# Patient Record
Sex: Female | Born: 1950 | Hispanic: No | Marital: Married | Smoking: Never smoker
Health system: Southern US, Community
[De-identification: ages and names within clinical notes are randomized; demographics above are authoritative.]

## PROBLEM LIST (undated history)

## (undated) DIAGNOSIS — Z86010 Personal history of colonic polyps: Secondary | ICD-10-CM

## (undated) DIAGNOSIS — F4323 Adjustment disorder with mixed anxiety and depressed mood: Secondary | ICD-10-CM

## (undated) DIAGNOSIS — E7800 Pure hypercholesterolemia, unspecified: Secondary | ICD-10-CM

## (undated) DIAGNOSIS — M159 Polyosteoarthritis, unspecified: Secondary | ICD-10-CM

## (undated) DIAGNOSIS — M810 Age-related osteoporosis without current pathological fracture: Secondary | ICD-10-CM

## (undated) HISTORY — DX: Personal history of colonic polyps: Z86.010

## (undated) HISTORY — DX: Polyosteoarthritis, unspecified: M15.9

## (undated) HISTORY — DX: Pure hypercholesterolemia, unspecified: E78.00

## (undated) HISTORY — DX: Adjustment disorder with mixed anxiety and depressed mood: F43.23

## (undated) HISTORY — PX: CHOLECYSTECTOMY: SHX55

## (undated) HISTORY — DX: Age-related osteoporosis without current pathological fracture: M81.0

---

## 2003-08-07 ENCOUNTER — Observation Stay (HOSPITAL_COMMUNITY): Admission: RE | Admit: 2003-08-07 | Discharge: 2003-08-08 | Payer: Self-pay | Admitting: Urology

## 2005-02-21 IMAGING — CR DG CHEST 2V
2 series · 2 of 2 positions shown · non-contrast
Comparison: None.

CLINICAL DATA: Urinary incontinence.  Preoperative respiratory exam.
 PA AND LATERAL CHEST

[view not recorded (1 of 2)]
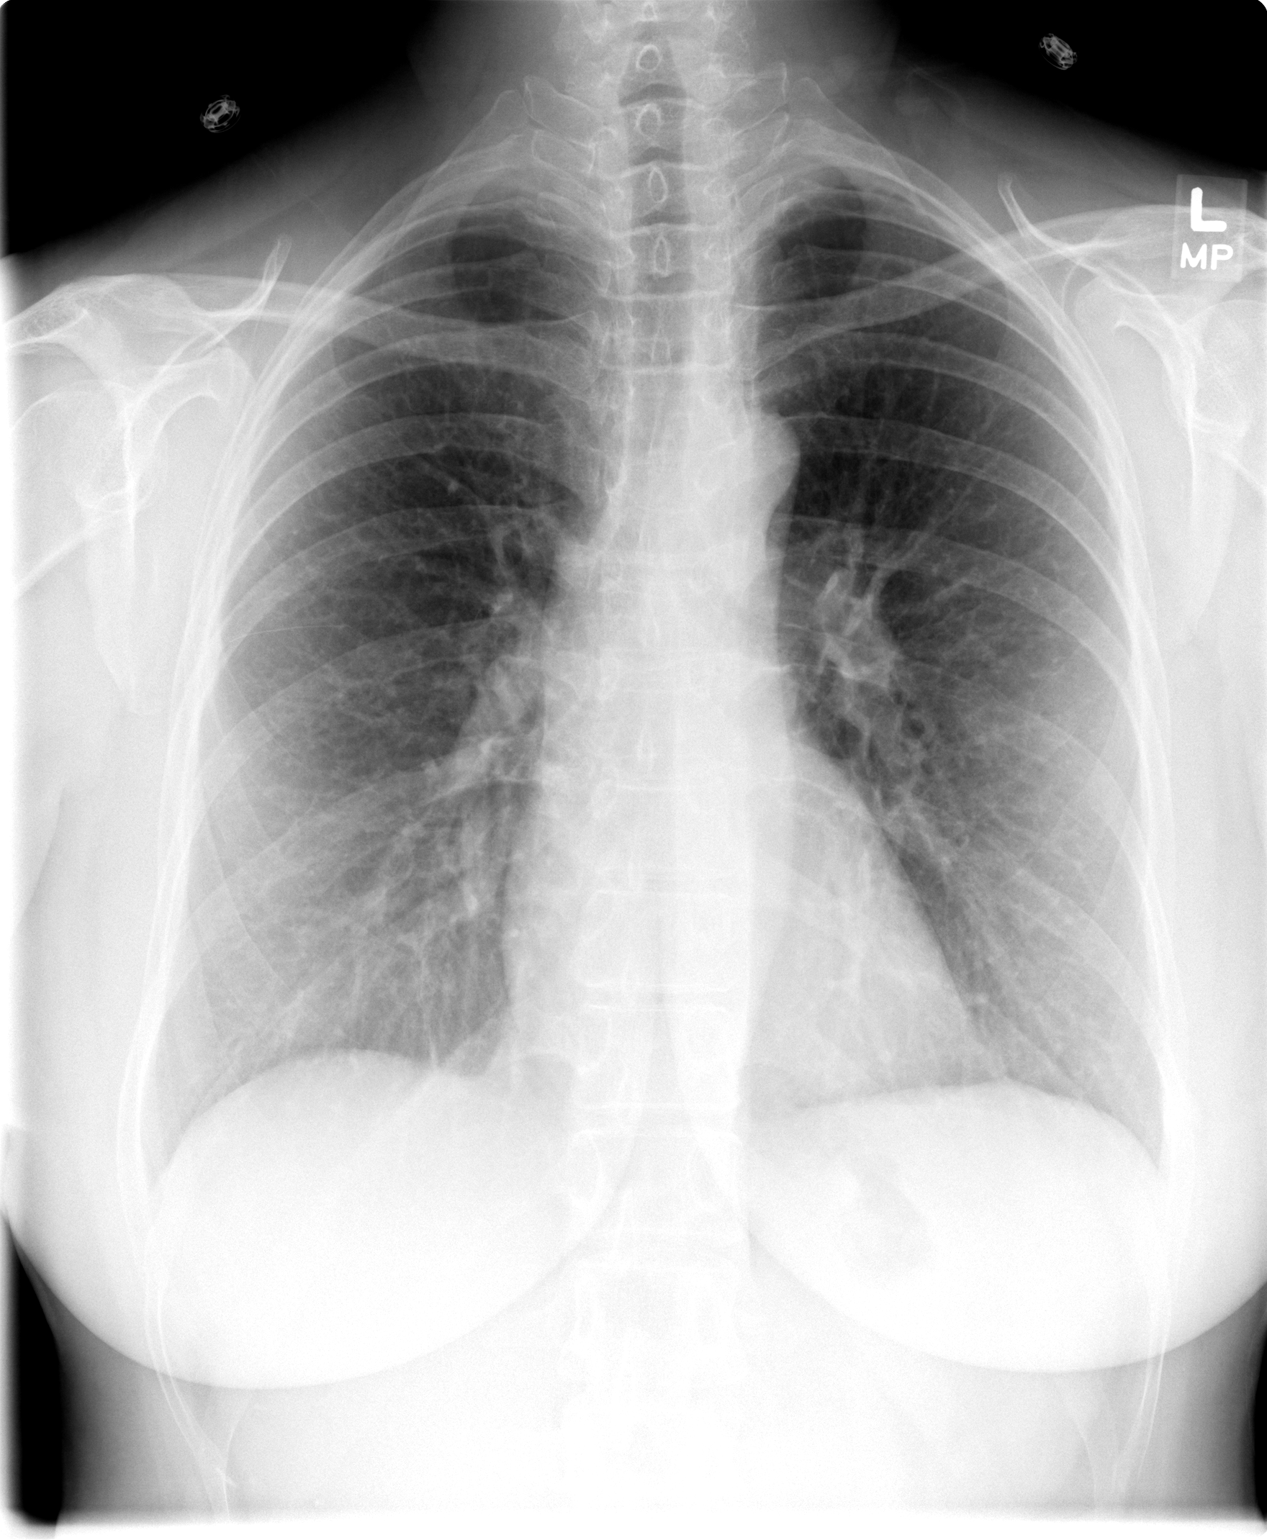

[view not recorded (2 of 2)]
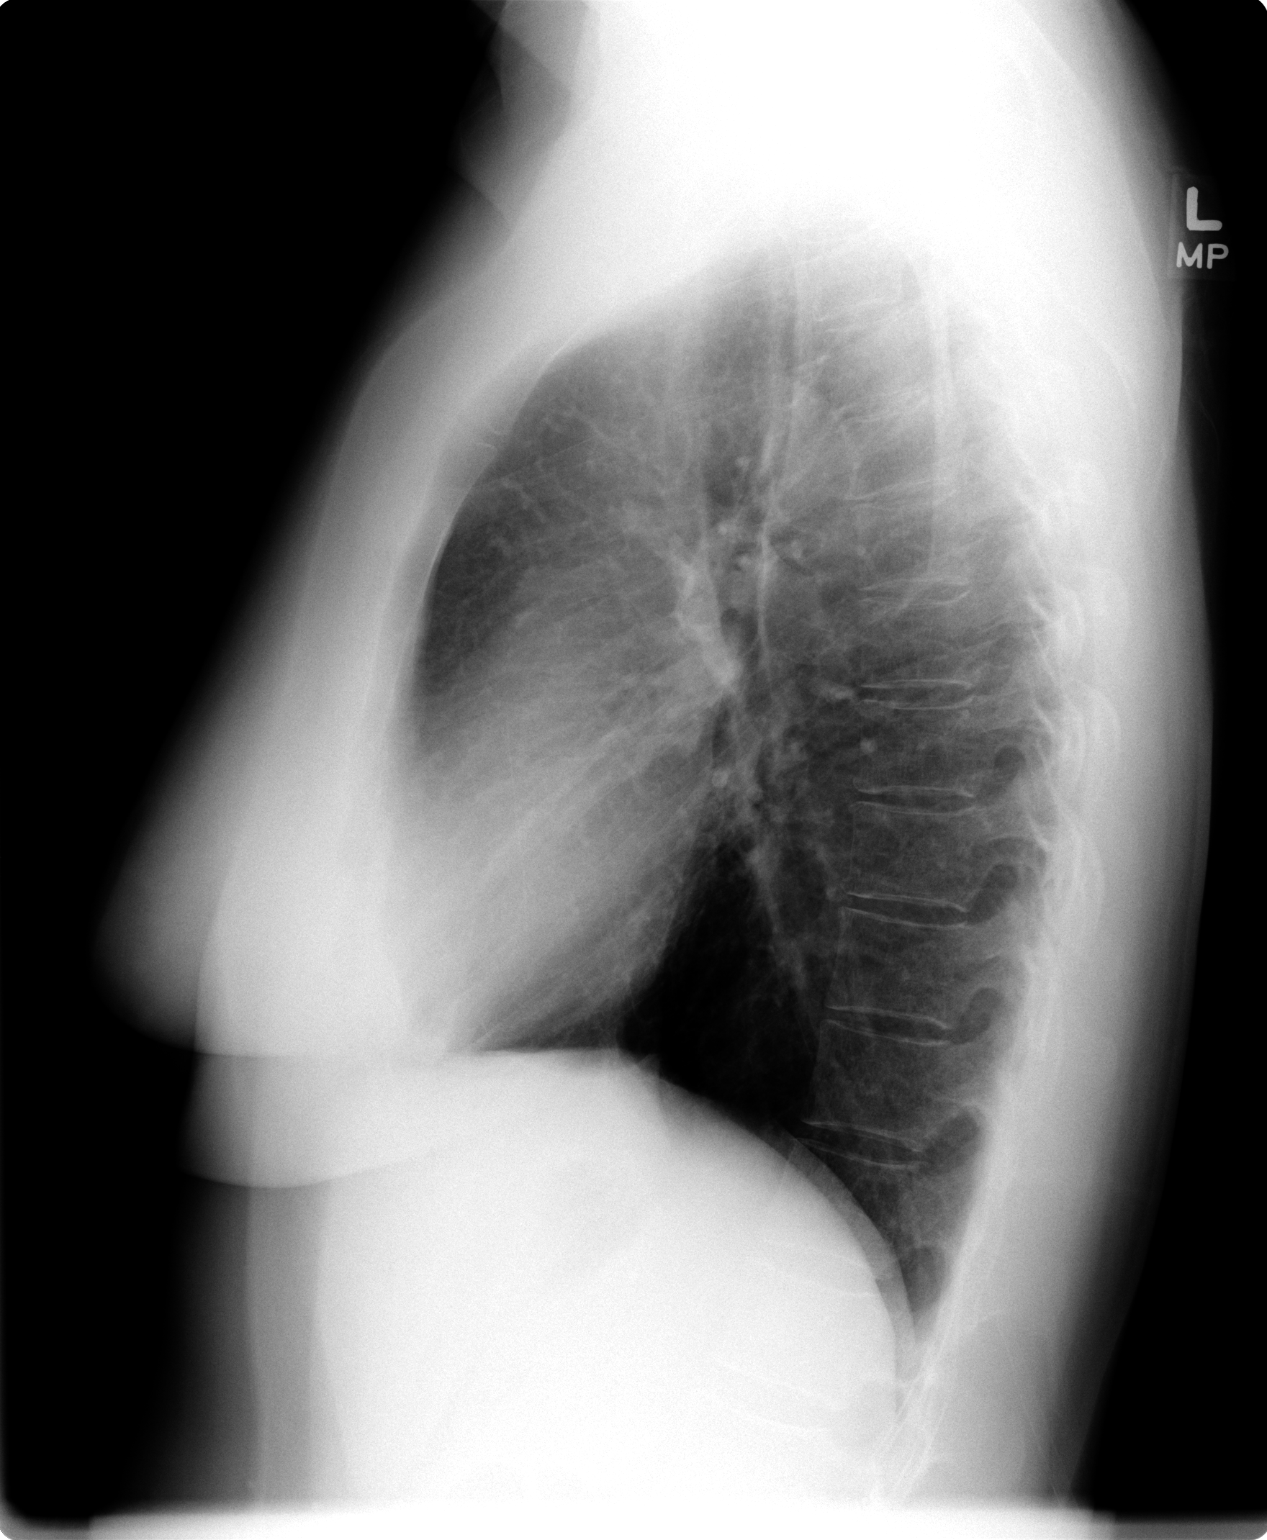

[2 of 2 positions shown; findings below may reference images not displayed]

The cardiomediastinal contours are normal.  The lungs are clear.  There is no pleural effusion or pneumothorax.  
 IMPRESSION
 No active cardiopulmonary process is demonstrated.

## 2014-06-17 HISTORY — PX: BUNIONECTOMY: SHX129

## 2016-08-30 ENCOUNTER — Encounter: Payer: Self-pay | Admitting: Cardiovascular Disease

## 2016-09-11 NOTE — Progress Notes (Signed)
Cardiology Office Note   Date:  09/15/2016   ID:  Renee Wilson, Renee Wilson 30-Dec-1950, MRN [id number]  PCP:  Lise Auer, MD  Cardiologist:   Charlton Haws, MD   Chief Complaint  Patient presents with  . Establish Care       History of Present Illness: Renee Wilson is a 66 y.o. female who presents for evaluation/consultation CAD Referred by Dr Flonnie Overman [facility] Reviewed his office note from 08/15/16 and only mentions referral for family history of premature CAD Patient wishes to assess her CV risk   Chol 230 LDL 165 had leg pains with simvastatin   Father with ischemic heart disease paternal grandmother and her brother with died MI 42's  She hears her pulse in left neck especially at rest on side  No TIA symptoms  On her feet a lot some SSCP sharp and fleeting with exertion.   4 years ago was on simvastatin and felt her legs ached more  Reviewed labs and LDL 119   Also complains of some exertional dyspnea and palpitations   Past Medical History:  Diagnosis Date  . Adjustment disorder with mixed anxiety and depressed mood   . Generalized osteoarthritis of hand   . H/O adenomatous polyp of colon   . Hypercholesterolemia   . Osteoporosis     Past Surgical History:  Procedure Laterality Date  . BUNIONECTOMY  06/17/2014  . CHOLECYSTECTOMY       Current Outpatient Prescriptions  Medication Sig Dispense Refill  . ALPRAZolam (XANAX) 0.5 MG tablet Take 0.5 mg by mouth at bedtime as needed for anxiety.    . cetirizine (ZYRTEC) 10 MG tablet Take 10 mg by mouth daily.    . cyclobenzaprine (FLEXERIL) 10 MG tablet Take 10 mg by mouth 3 (three) times daily as needed for muscle spasms.    . meclizine (ANTIVERT) 25 MG tablet Take 25 mg by mouth 3 (three) times daily as needed for dizziness.    . sertraline (ZOLOFT) 100 MG tablet Take 100 mg by mouth daily.    . Vitamin D, Ergocalciferol, (DRISDOL) 50000 units CAPS capsule Take 50,000 Units by mouth every 7 (seven) days.     Marland Kitchen zolpidem (AMBIEN CR) 12.5 MG CR tablet Take 12.5 mg by mouth at bedtime as needed for sleep.    . rosuvastatin (CRESTOR) 5 MG tablet Take 1 tablet (5 mg total) by mouth daily. 90 tablet 3   No current facility-administered medications for this visit.     Allergies:   Morphine    Social History:  The patient  reports that she has never smoked. She has never used smokeless tobacco. She reports that she does not drink alcohol or use drugs.   Family History:  The patient's family history includes Heart attack in her paternal grandmother; Heart disease in her father; Ovarian cancer (age of onset: 37) in her mother.    ROS:  Please see the history of present illness.   Otherwise, review of systems are positive for none.   All other systems are reviewed and negative.    PHYSICAL EXAM: VS:  BP 124/74   Pulse 83   Ht  (1.702 m)   Wt 170 lb 6.4 oz (77.3 kg)   SpO2 96%   BMI 26.69 kg/m  , BMI Body mass index is 26.69 kg/m. Affect appropriate Healthy:  appears stated age HEENT: normal Neck supple with no adenopathy JVP normal no bruits no thyromegaly Lungs clear with no  wheezing and good diaphragmatic motion Heart:  S1/S2 no murmur, no rub, gallop or click PMI normal Abdomen: benighn, BS positve, no tenderness, no AAA no bruit.  No HSM or HJR Distal pulses intact with no bruits No edema Neuro non-focal Skin warm and dry No muscular weakness    EKG:  NSR normal ECG 09/15/16    Recent Labs: No results found for requested labs within last 8760 hours.    Lipid Panel No results found for: CHOL, TRIG, HDL, CHOLHDL, VLDL, LDLCALC, LDLDIRECT    Wt Readings from Last 3 Encounters:  09/15/16 170 lb 6.4 oz (77.3 kg)      Other studies Reviewed: Additional studies/ records that were reviewed today include: Office notes Dr Flonnie Overman [facility] family and  Labs .    ASSESSMENT AND PLAN:  1.  Family History CAD vascular screening bundle ordered 2. Cholesterol start  crestor 5 mg M/W/F taget LDL 70 or less 3. Depression continue zoloft seems compensated 4. Chest Pain atypical f/u ETT with normal ECG 5. Carotid faint murmur radiating to left clavicle f/u carotid duplex  6. Palpitations: benign related to anxiety ECG normal no need for monitor TSH/T4    Current medicines are reviewed at length with the patient today.  The patient does not have concerns regarding medicines.  The following changes have been made:  Crestor   Labs/ tests ordered today include: TSH/T4 carotid vascular bundle ETT   Orders Placed This Encounter  Procedures  . EXERCISE TOLERANCE TEST  . EKG 12-Lead     Disposition:   FU with me PRN      Signed, Charlton Haws, MD  09/15/2016 9:20 AM    [facility] [address] [place], [place], [place]  [address] Phone: [phone]; Fax: [phone]

## 2016-09-15 ENCOUNTER — Encounter (INDEPENDENT_AMBULATORY_CARE_PROVIDER_SITE_OTHER): Payer: Self-pay

## 2016-09-15 ENCOUNTER — Ambulatory Visit (INDEPENDENT_AMBULATORY_CARE_PROVIDER_SITE_OTHER): Payer: Commercial Managed Care - PPO | Admitting: Cardiovascular Disease

## 2016-09-15 ENCOUNTER — Encounter: Payer: Self-pay | Admitting: Cardiovascular Disease

## 2016-09-15 VITALS — BP 124/74 | HR 83 | Ht 67.0 in | Wt 170.4 lb

## 2016-09-15 DIAGNOSIS — R0989 Other specified symptoms and signs involving the circulatory and respiratory systems: Secondary | ICD-10-CM

## 2016-09-15 DIAGNOSIS — R079 Chest pain, unspecified: Secondary | ICD-10-CM | POA: Diagnosis not present

## 2016-09-15 DIAGNOSIS — Z7689 Persons encountering health services in other specified circumstances: Secondary | ICD-10-CM

## 2016-09-15 MED ORDER — ROSUVASTATIN CALCIUM 5 MG PO TABS
5.0000 mg | ORAL_TABLET | Freq: Every day | ORAL | 3 refills | Status: AC
Start: 2016-09-15 — End: 2016-12-14

## 2016-09-15 NOTE — Patient Instructions (Addendum)
Medication Instructions:  Your physician has recommended you make the following change in your medication:  START Crestor 5 mg by mouth daily  Lab work: NONE  Testing/Procedures: Your physician has requested that you have a vascular screening and carotid duplex at our [facility]. This test is an ultrasound of the carotid arteries in your neck. It looks at blood flow through these arteries that supply the brain with blood. Allow one hour for this exam. There are no restrictions or special instructions.  Your physician has requested that you have an exercise tolerance test at our [facility]. For further information please visit [email or web address] Please also follow instruction sheet, as given.  Follow-Up: Your physician wants you to follow-up as needed with Dr. Eden Emms.   If you need a refill on your cardiac medications before your next appointment, please call your pharmacy.

## 2016-10-04 ENCOUNTER — Telehealth (HOSPITAL_COMMUNITY): Payer: Self-pay

## 2016-10-04 NOTE — Telephone Encounter (Signed)
Encounter complete. 

## 2016-10-05 ENCOUNTER — Ambulatory Visit (HOSPITAL_COMMUNITY)
Admission: RE | Admit: 2016-10-05 | Discharge: 2016-10-05 | Disposition: A | Discharge status: Home / Self Care | Payer: Commercial Managed Care - PPO | Source: Ambulatory Visit | Attending: Cardiology | Admitting: Cardiology

## 2016-10-05 DIAGNOSIS — I6523 Occlusion and stenosis of bilateral carotid arteries: Secondary | ICD-10-CM | POA: Insufficient documentation

## 2016-10-05 DIAGNOSIS — R079 Chest pain, unspecified: Secondary | ICD-10-CM | POA: Diagnosis not present

## 2016-10-05 DIAGNOSIS — R0989 Other specified symptoms and signs involving the circulatory and respiratory systems: Secondary | ICD-10-CM

## 2016-10-05 LAB — EXERCISE TOLERANCE TEST
CHL RATE OF PERCEIVED EXERTION: 18
CSEPED: 6 min
CSEPEDS: 1 s
Estimated workload: 7 METS
MPHR: 152 {beats}/min
Peak HR: 146 {beats}/min
Percent HR: 96 %
Rest HR: 61 {beats}/min

## 2016-10-11 ENCOUNTER — Telehealth: Payer: Self-pay | Admitting: *Deleted

## 2016-10-11 ENCOUNTER — Telehealth: Payer: Self-pay

## 2016-10-11 NOTE — Telephone Encounter (Addendum)
LVM for pt, informed her of 81 mg ASA added to list & ok to take.  VAS US CAROTID  Order: [id number]  Status:  Final result Visible to patient:  No (Not Released) Dx:  Bruit  Notes recorded by Wendall StadeNishan, Peter C, MD on 10/11/2016 at 10:27 AM EDT 81 mg is fine ------  Notes recorded by Ethelda ChickPate Ingalls, Pamela, RN on 10/11/2016 at 10:00 AM EDT Patient call back for her carotid results. Per Dr. Eden EmmsNishan, Plaque no stenosis f/u carotid duplex in 2 years. Patient wanted to know if she should start taking ASA, will forward to Dr. Eden EmmsNishan for advisement. ------  Notes recorded by Ethelda ChickPate Ingalls, Pamela, RN on 10/11/2016 at 9:01 AM EDT Patient will need carotid duplex repeated in May 2020 ------  Notes recorded by Ethelda ChickPate Ingalls, Pamela, RN on 10/11/2016 at 8:59 AM EDT Left message for patient to call back. ------  Notes recorded by Wendall StadeNishan, Peter C, MD on 10/07/2016 at 12:15 PM EDT Plaque no stenosis f/u carotid duplex in 2 years

## 2016-10-11 NOTE — Telephone Encounter (Signed)
-----   Message from Wendall StadePeter C Nishan, MD sent at 10/07/2016 12:15 PM EDT ----- Plaque no stenosis f/u carotid duplex in 2 years

## 2016-10-11 NOTE — Progress Notes (Signed)
Patient will need carotid duplex repeated in May 2020

## 2016-10-11 NOTE — Telephone Encounter (Signed)
Left message for patient to call back  

## 2016-10-11 NOTE — Telephone Encounter (Signed)
Called patient and left message. Dr. Eden EmmsNishan is fine with patient taking ASA 81 mg.

## 2023-05-29 DIAGNOSIS — I517 Cardiomegaly: Secondary | ICD-10-CM | POA: Diagnosis not present
